# Patient Record
Sex: Female | Born: 1985 | Hispanic: Yes | Marital: Married | Smoking: Never smoker
Health system: Southern US, Community
[De-identification: ages and names within clinical notes are randomized; demographics above are authoritative.]

## PROBLEM LIST (undated history)

## (undated) DIAGNOSIS — L409 Psoriasis, unspecified: Secondary | ICD-10-CM

## (undated) HISTORY — DX: Psoriasis, unspecified: L40.9

---

## 2011-09-27 DIAGNOSIS — IMO0002 Reserved for concepts with insufficient information to code with codable children: Secondary | ICD-10-CM

## 2015-06-21 LAB — OB RESULTS CONSOLE HGB/HCT, BLOOD
HCT: 34 %
Hemoglobin: 11.5 g/dL

## 2015-06-21 LAB — OB RESULTS CONSOLE ANTIBODY SCREEN: Antibody Screen: NEGATIVE

## 2015-06-21 LAB — OB RESULTS CONSOLE PLATELET COUNT: Platelets: 280 10*3/uL

## 2015-06-21 LAB — OB RESULTS CONSOLE HIV ANTIBODY (ROUTINE TESTING): HIV: NONREACTIVE

## 2015-06-21 LAB — OB RESULTS CONSOLE ABO/RH: RH TYPE: POSITIVE

## 2015-06-21 LAB — OB RESULTS CONSOLE RUBELLA ANTIBODY, IGM: Rubella: IMMUNE

## 2015-06-21 LAB — OB RESULTS CONSOLE HEPATITIS B SURFACE ANTIGEN: HEP B S AG: NEGATIVE

## 2015-08-15 ENCOUNTER — Encounter: Payer: Self-pay | Admitting: *Deleted

## 2015-08-15 DIAGNOSIS — Z3490 Encounter for supervision of normal pregnancy, unspecified, unspecified trimester: Secondary | ICD-10-CM

## 2015-08-31 ENCOUNTER — Encounter: Payer: Self-pay | Admitting: Obstetrics & Gynecology

## 2015-09-14 ENCOUNTER — Encounter: Payer: Self-pay | Admitting: Obstetrics & Gynecology

## 2015-09-14 ENCOUNTER — Ambulatory Visit (INDEPENDENT_AMBULATORY_CARE_PROVIDER_SITE_OTHER): Admitting: Obstetrics & Gynecology

## 2015-09-14 VITALS — BP 126/75 | HR 69 | Wt 149.8 lb

## 2015-09-14 DIAGNOSIS — Z3492 Encounter for supervision of normal pregnancy, unspecified, second trimester: Secondary | ICD-10-CM | POA: Diagnosis not present

## 2015-09-14 DIAGNOSIS — Z3689 Encounter for other specified antenatal screening: Secondary | ICD-10-CM

## 2015-09-14 DIAGNOSIS — Z36 Encounter for antenatal screening of mother: Secondary | ICD-10-CM

## 2015-09-14 DIAGNOSIS — O34219 Maternal care for unspecified type scar from previous cesarean delivery: Secondary | ICD-10-CM | POA: Insufficient documentation

## 2015-09-14 LAB — POCT URINALYSIS DIP (DEVICE)
BILIRUBIN URINE: NEGATIVE
Glucose, UA: 500 mg/dL — AB
KETONES UR: NEGATIVE mg/dL
LEUKOCYTES UA: NEGATIVE
NITRITE: NEGATIVE
PH: 6 (ref 5.0–8.0)
Protein, ur: NEGATIVE mg/dL
SPECIFIC GRAVITY, URINE: 1.01 (ref 1.005–1.030)
Urobilinogen, UA: 0.2 mg/dL (ref 0.0–1.0)

## 2015-09-14 NOTE — Progress Notes (Signed)
New ob packet given  Edema- feet  Pain- ligament  Pt requests for pap smear to be completed post delivery

## 2015-09-14 NOTE — Progress Notes (Signed)
Subjective:  Beverly Moran is a 30 y.o. G2P1001 at 403w5d being seen today for transfer of prenatal care from [place]. No issues during pregnancy thus far, had one term cesarean section.  She is currently monitored for the following issues for this low-risk pregnancy and has Supervision of low-risk pregnancy and Previous cesarean delivery, antepartum on her problem list.  Patient reports no complaints.  Contractions: Not present. Vag. Bleeding: None.  Movement: Present. Denies leaking of fluid.   Past Medical History  Diagnosis Date  . Psoriasis    Past Surgical History  Procedure Laterality Date  . Cesarean section  2013   The following portions of the patient's history were reviewed and updated as appropriate: allergies, current medications, past family history, past medical history, past social history, past surgical history and problem list. Problem list updated.  Objective:   Filed Vitals:   09/14/15 0925  BP: 126/75  Pulse: 69  Weight: 149 lb 12.8 oz (67.949 kg)    Fetal Status: Fetal Heart Rate (bpm): 152 Fundal Height: 23 cm Movement: Present     General:  Alert, oriented and cooperative. Patient is in no acute distress.  Skin: Skin is warm and dry. No rash noted.   Cardiovascular: Normal heart rate noted  Respiratory: Normal respiratory effort, no problems with respiration noted  Abdomen: Soft, gravid, appropriate for gestational age. Pain/Pressure: Present     Pelvic: Vag. Bleeding: None    Cervical exam deferred        Extremities: Normal range of motion.  Edema: Trace  Mental Status: Normal mood and affect. Normal behavior. Normal judgment and thought content.   Urinalysis: Urine Protein: Negative Urine Glucose: 3+  Assessment and Plan:  Pregnancy: G2P1001 at 353w5d  1. Previous cesarean delivery, antepartum She was given TOLAC consent to review at home. Will discuss/sign next visit.  2. Encounter for fetal anatomic survey - US MFM OB COMP + 14  WK; Future  3. Supervision of low-risk pregnancy, second trimester - AFP, Quad Screen.   Rest of labs reviewed  The [facility] with multiple MDs and other Advanced Practice Providers was explained to patient; also emphasized that residents, students are part of our team. Preterm labor symptoms and general obstetric precautions including but not limited to vaginal bleeding, contractions, leaking of fluid and fetal movement were reviewed in detail with the patient. Please refer to After Visit Summary for other counseling recommendations.  Return in about 4 weeks (around 10/12/2015) for 1 hr GTT, OB Visit, 3rd trimester labs, TDap.   Tereso NewcomerUgonna A Ozell Juhasz, MD

## 2015-09-14 NOTE — Progress Notes (Signed)
U/S scheduled 04/27 @ 815am.

## 2015-09-14 NOTE — Patient Instructions (Addendum)
Return to clinic for any scheduled appointments or obstetric concerns, or go to [facility] for evaluation  Trial of Labor After Cesarean Delivery Information A trial of labor after cesarean delivery (TOLAC) is when a woman tries to give birth vaginally after a previous cesarean delivery. TOLAC may be a safe and appropriate option for you depending on your medical history and other risk factors. When TOLAC is successful and you are able to have a vaginal delivery, this is called a vaginal birth after cesarean delivery (VBAC).  CANDIDATES FOR TOLAC TOLAC is possible for some women who:  Have undergone one or two prior cesarean deliveries in which the incision of the uterus was horizontal (low transverse).  Are carrying twins and have had one prior low transverse incision during a cesarean delivery.  Do not have a vertical (classical) uterine scar.  Have not had a tear in the wall of their uterus (uterine rupture). TOLAC is also supported for women who meet appropriate criteria and:  Are under the age of 40 years.  Are tall and have a body mass index (BMI) of less than 30.  Have an unknown uterine scar.  Give birth in a facility equipped to handle an emergency cesarean delivery. This team should be able to handle possible complications such as a uterine rupture.  Have thorough counseling about the benefits and risks of TOLAC.  Have discussed future pregnancy plans with their health care provider.  Plan to have several more pregnancies. MOST SUCCESSFUL CANDIDATES FOR TOLAC:  Have had a successful vaginal delivery before or after their cesarean delivery.  Experience labor that begins naturally on or before the due date (40 weeks of gestation).  Do not have a very large (macrosomic) baby.   Had a prior cesarean delivery but are not currently experiencing factors that would prompt a cesarean delivery (such as a breech position).  Had only one prior cesarean delivery.  Had a prior  cesarean delivery that was performed early in labor and not after full cervical dilation. TOLAC may be most appropriate for women who meet the above guidelines and who plan to have more pregnancies. TOLAC is not recommended for home births. LEAST SUCCESSFUL CANDIDATES FOR TOLAC:  Have an induced labor with an unfavorable cervix. An unfavorable cervix is when the cervix is not dilating enough (among other factors).  Have never had a vaginal delivery.  Have had more than two cesarean deliveries.  Have a pregnancy at more than 40 weeks of gestation.  Are pregnant with a baby with a suspected weight greater than 4,000 grams (8 pounds) and who have no prior history of a vaginal delivery.  Have closely spaced pregnancies. SUGGESTED BENEFITS OF TOLAC  You may have a faster recovery time.  You may have a shorter stay in the hospital.  You may have less pain and fewer problems than with a cesarean delivery. Women who have a cesarean delivery have a higher chance of needing blood or getting a fever, an infection, or a blood clot in the legs. SUGGESTED RISKS OF TOLAC The highest risk of complications happens to women who attempt a TOLAC and fail. A failed TOLAC results in an unplanned cesarean delivery. Risks related to TOLAC or repeat cesarean deliveries include:   Blood loss.  Infection.  Blood clot.  Injury to surrounding tissues or organs.  Having to remove the uterus (hysterectomy).  Potential problems with the placenta (such as placenta previa or placenta accreta) in future pregnancies. Although very rare, the main concerns   with TOLAC are:  Rupture of the uterine scar from a past cesarean delivery.  Needing an emergency cesarean delivery.  Having a bad outcome for the baby (perinatal morbidity). FOR MORE INFORMATION American Congress of Obstetricians and Gynecologists: www.acog.org [employer]: www.midwife.org   This information is not intended to  replace advice given to you by your health care provider. Make sure you discuss any questions you have with your health care provider.   Document Released: 01/29/2011 Document Revised: 03/03/2013 Document Reviewed: 11/02/2012 Elsevier Interactive Patient Education 2016 [employer]  Second Trimester of Pregnancy The second trimester is from week 13 through week 28, months 4 through 6. The second trimester is often a time when you feel your best. Your body has also adjusted to being pregnant, and you begin to feel better physically. Usually, morning sickness has lessened or quit completely, you may have more energy, and you may have an increase in appetite. The second trimester is also a time when the fetus is growing rapidly. At the end of the sixth month, the fetus is about 9 inches long and weighs about 1 pounds. You will likely begin to feel the baby move (quickening) between 18 and 20 weeks of the pregnancy. BODY CHANGES Your body goes through many changes during pregnancy. The changes vary from woman to woman.   Your weight will continue to increase. You will notice your lower abdomen bulging out.  You may begin to get stretch marks on your hips, abdomen, and breasts.  You may develop headaches that can be relieved by medicines approved by your health care provider.  You may urinate more often because the fetus is pressing on your bladder.  You may develop or continue to have heartburn as a result of your pregnancy.  You may develop constipation because certain hormones are causing the muscles that push waste through your intestines to slow down.  You may develop hemorrhoids or swollen, bulging veins (varicose veins).  You may have back pain because of the weight gain and pregnancy hormones relaxing your joints between the bones in your pelvis and as a result of a shift in weight and the muscles that support your balance.  Your breasts will continue to grow and be tender.  Your  gums may bleed and may be sensitive to brushing and flossing.  Dark spots or blotches (chloasma, mask of pregnancy) may develop on your face. This will likely fade after the baby is born.  A dark line from your belly button to the pubic area (linea nigra) may appear. This will likely fade after the baby is born.  You may have changes in your hair. These can include thickening of your hair, rapid growth, and changes in texture. Some women also have hair loss during or after pregnancy, or hair that feels dry or thin. Your hair will most likely return to normal after your baby is born. WHAT TO EXPECT AT YOUR PRENATAL VISITS During a routine prenatal visit:  You will be weighed to make sure you and the fetus are growing normally.  Your blood pressure will be taken.  Your abdomen will be measured to track your baby's growth.  The fetal heartbeat will be listened to.  Any test results from the previous visit will be discussed. Your health care provider may ask you:  How you are feeling.  If you are feeling the baby move.  If you have had any abnormal symptoms, such as leaking fluid, bleeding, severe headaches, or abdominal  cramping.  If you are using any tobacco products, including cigarettes, chewing tobacco, and electronic cigarettes.  If you have any questions. Other tests that may be performed during your second trimester include:  Blood tests that check for:  Low iron levels (anemia).  Gestational diabetes (between 24 and 28 weeks).  Rh antibodies.  Urine tests to check for infections, diabetes, or protein in the urine.  An ultrasound to confirm the proper growth and development of the baby.  An amniocentesis to check for possible genetic problems.  Fetal screens for spina bifida and Down syndrome.  HIV (human immunodeficiency virus) testing. Routine prenatal testing includes screening for HIV, unless you choose not to have this test. HOME CARE INSTRUCTIONS   Avoid  all smoking, herbs, alcohol, and unprescribed drugs. These chemicals affect the formation and growth of the baby.  Do not use any tobacco products, including cigarettes, chewing tobacco, and electronic cigarettes. If you need help quitting, ask your health care provider. You may receive counseling support and other resources to help you quit.  Follow your health care provider's instructions regarding medicine use. There are medicines that are either safe or unsafe to take during pregnancy.  Exercise only as directed by your health care provider. Experiencing uterine cramps is a good sign to stop exercising.  Continue to eat regular, healthy meals.  Wear a good support bra for breast tenderness.  Do not use hot tubs, steam rooms, or saunas.  Wear your seat belt at all times when driving.  Avoid raw meat, uncooked cheese, cat litter boxes, and soil used by cats. These carry germs that can cause birth defects in the baby.  Take your prenatal vitamins.  Take 1500-2000 mg of calcium daily starting at the 20th week of pregnancy until you deliver your baby.  Try taking a stool softener (if your health care provider approves) if you develop constipation. Eat more high-fiber foods, such as fresh vegetables or fruit and whole grains. Drink plenty of fluids to keep your urine clear or pale yellow.  Take warm sitz baths to soothe any pain or discomfort caused by hemorrhoids. Use hemorrhoid cream if your health care provider approves.  If you develop varicose veins, wear support hose. Elevate your feet for 15 minutes, 3-4 times a day. Limit salt in your diet.  Avoid heavy lifting, wear low heel shoes, and practice good posture.  Rest with your legs elevated if you have leg cramps or low back pain.  Visit your dentist if you have not gone yet during your pregnancy. Use a soft toothbrush to brush your teeth and be gentle when you floss.  A sexual relationship may be continued unless your health  care provider directs you otherwise.  Continue to go to all your prenatal visits as directed by your health care provider. SEEK MEDICAL CARE IF:   You have dizziness.  You have mild pelvic cramps, pelvic pressure, or nagging pain in the abdominal area.  You have persistent nausea, vomiting, or diarrhea.  You have a bad smelling vaginal discharge.  You have pain with urination. SEEK IMMEDIATE MEDICAL CARE IF:   You have a fever.  You are leaking fluid from your vagina.  You have spotting or bleeding from your vagina.  You have severe abdominal cramping or pain.  You have rapid weight gain or loss.  You have shortness of breath with chest pain.  You notice sudden or extreme swelling of your face, hands, ankles, feet, or legs.  You have not  felt your baby move in over an hour.  You have severe headaches that do not go away with medicine.  You have vision changes.   This information is not intended to replace advice given to you by your health care provider. Make sure you discuss any questions you have with your health care provider.   Document Released: 05/07/2001 Document Revised: 06/03/2014 Document Reviewed: 07/14/2012 Elsevier Interactive Patient Education 2016 [employer].   AREA PEDIATRIC/FAMILY PRACTICE PHYSICIANS  [facility] [address] [place], [place] [address] Phone - [phone]   Fax - [phone]  JACK AMOS [address] [place], [place]  [address] Phone - [phone]   Fax - [phone]  [facility] [address] [place], [place]  [address] Phone - [phone]   Fax - [phone]  [facility] [address] [place], [place]  [address] Phone - [phone]   Fax - [phone]  [facility] [address] [place], [place]  [address] Phone - [phone]   Fax - [phone]  [facility] [address] [place],  [place]  [address] Phone - [phone]   Fax - [phone]  [facility] [place] [address] [place], [place]  [address] Phone - [phone]   Fax - [phone]  [facility] [address] [place] [place], [place]  [address] Phone - [phone]   Fax - [phone]  [facility] [address] [place], [place]  [address] Phone - [phone]   Fax - [phone] [facility] [place] [address] [place], [place]  [address] Phone - [phone]   Fax - [phone]  [facility] [address] [place], [place]  [address] Phone - [phone]   Fax - [phone]  [facility] [address], [address] [place], [place]  [address] Phone - [phone]   Fax - [phone]  [facility] [place] [address] [place], [place]  [address] Phone - [phone]   Fax - [phone]  [facility] [address] [place], [place]  [address] Phone - [phone]  [facility] [address] [place], [place]  [address] Phone - [phone]   Fax - [phone]  [facility] [address] [place], [place]  [address] Phone - [phone]   Fax - [phone]  [facility] [address] [place], [place]  [address] Phone - [phone]   Fax - [phone]  [facility] [address] [place], [place]  [address] Phone - [phone]   Fax - [phone]   [facility] [address] [place], [place]  [address] Phone - [phone]   Fax - [phone]  [facility] [address] [place], [place]  [address] Phone - [phone]   Fax - [phone]  DAVID RUBIN [address] [place], [place]  [address] Phone - [phone]   Fax - [phone]  [facility] [address] [place], [place]  [address] Phone - [phone]   Fax - [phone]  [facility] Al[facility] Ch[facility] [address] [place], [place]  [address] Phone - [phone]   Fax - [phone] Gerarda Fraction [address] [place], [place]  [address] Phone - [phone]   Fax - [phone]  [facility]  [address] [place], [place]  [address] Phone - [phone]   Fax - [phone]  [facility] [place] [address] [place], [place]  [address] Phone - [phone]   Fax - [phone]

## 2015-09-15 LAB — AFP, QUAD SCREEN
AFP: 112.2 ng/mL
Curr Gest Age: 22.7 weeks
Down Syndrome Scr Risk Est: 1:275 {titer}
HCG, Total: 52.66 IU/mL
INH: 229.3 pg/mL
INTERPRETATION-AFP: NEGATIVE
MOM FOR AFP: 1.32
MOM FOR INH: 1.2
MoM for hCG: 3.2
OPEN SPINA BIFIDA: NEGATIVE
Osb Risk: 1:4670 {titer}
Tri 18 Scr Risk Est: NEGATIVE
Trisomy 18 (Edward) Syndrome Interp.: <1:26500 {titer}
UE3 MOM: 0.6
uE3 Value: 2 ng/mL

## 2015-09-21 ENCOUNTER — Ambulatory Visit (HOSPITAL_COMMUNITY)
Admission: RE | Admit: 2015-09-21 | Discharge: 2015-09-21 | Disposition: A | Discharge status: Home / Self Care | Source: Ambulatory Visit | Attending: Obstetrics & Gynecology | Admitting: Obstetrics & Gynecology

## 2015-09-21 ENCOUNTER — Other Ambulatory Visit: Payer: Self-pay | Admitting: Obstetrics & Gynecology

## 2015-09-21 DIAGNOSIS — O34219 Maternal care for unspecified type scar from previous cesarean delivery: Secondary | ICD-10-CM

## 2015-09-21 DIAGNOSIS — Z3492 Encounter for supervision of normal pregnancy, unspecified, second trimester: Secondary | ICD-10-CM

## 2015-09-21 DIAGNOSIS — Z3A23 23 weeks gestation of pregnancy: Secondary | ICD-10-CM

## 2015-09-21 DIAGNOSIS — Z36 Encounter for antenatal screening of mother: Secondary | ICD-10-CM | POA: Diagnosis present

## 2015-09-21 DIAGNOSIS — O34212 Maternal care for vertical scar from previous cesarean delivery: Secondary | ICD-10-CM | POA: Diagnosis not present

## 2015-09-21 DIAGNOSIS — Z3689 Encounter for other specified antenatal screening: Secondary | ICD-10-CM

## 2015-10-10 ENCOUNTER — Encounter: Payer: Self-pay | Admitting: *Deleted

## 2015-10-10 ENCOUNTER — Ambulatory Visit (INDEPENDENT_AMBULATORY_CARE_PROVIDER_SITE_OTHER): Admitting: Student

## 2015-10-10 ENCOUNTER — Encounter: Payer: Self-pay | Admitting: Student

## 2015-10-10 VITALS — BP 105/58 | HR 74 | Wt 154.2 lb

## 2015-10-10 DIAGNOSIS — Z3482 Encounter for supervision of other normal pregnancy, second trimester: Secondary | ICD-10-CM

## 2015-10-10 DIAGNOSIS — Z3492 Encounter for supervision of normal pregnancy, unspecified, second trimester: Secondary | ICD-10-CM

## 2015-10-10 DIAGNOSIS — O34219 Maternal care for unspecified type scar from previous cesarean delivery: Secondary | ICD-10-CM

## 2015-10-10 DIAGNOSIS — Z23 Encounter for immunization: Secondary | ICD-10-CM | POA: Diagnosis not present

## 2015-10-10 LAB — POCT URINALYSIS DIP (DEVICE)
BILIRUBIN URINE: NEGATIVE
Glucose, UA: 500 mg/dL — AB
KETONES UR: NEGATIVE mg/dL
LEUKOCYTES UA: NEGATIVE
Nitrite: NEGATIVE
PH: 5.5 (ref 5.0–8.0)
Protein, ur: NEGATIVE mg/dL
Specific Gravity, Urine: 1.02 (ref 1.005–1.030)
Urobilinogen, UA: 0.2 mg/dL (ref 0.0–1.0)

## 2015-10-10 LAB — CBC
HCT: 32 % — ABNORMAL LOW (ref 35.0–45.0)
HEMOGLOBIN: 10.6 g/dL — AB (ref 11.7–15.5)
MCH: 29.4 pg (ref 27.0–33.0)
MCHC: 33.1 g/dL (ref 32.0–36.0)
MCV: 88.6 fL (ref 80.0–100.0)
MPV: 8.8 fL (ref 7.5–12.5)
Platelets: 248 10*3/uL (ref 140–400)
RBC: 3.61 MIL/uL — AB (ref 3.80–5.10)
RDW: 14 % (ref 11.0–15.0)
WBC: 12.2 10*3/uL — AB (ref 3.8–10.8)

## 2015-10-10 MED ORDER — TETANUS-DIPHTH-ACELL PERTUSSIS 5-2.5-18.5 LF-MCG/0.5 IM SUSP
0.5000 mL | Freq: Once | INTRAMUSCULAR | Status: AC
  Administered 2015-10-10: 0.5 mL via INTRAMUSCULAR

## 2015-10-10 NOTE — Patient Instructions (Signed)
Second Trimester of Pregnancy  The second trimester is from week 13 through week 28, month 4 through 6. This is often the time in pregnancy that you feel your best. Often times, morning sickness has lessened or quit. You may have more energy, and you may get hungry more often. Your unborn baby (fetus) is growing rapidly. At the end of the sixth month, he or she is about 9 inches long and weighs about 1½ pounds. You will likely feel the baby move (quickening) between 18 and 20 weeks of pregnancy.  HOME CARE   · Avoid all smoking, herbs, and alcohol. Avoid drugs not approved by your doctor.  · Do not use any tobacco products, including cigarettes, chewing tobacco, and electronic cigarettes. If you need help quitting, ask your doctor. You may get counseling or other support to help you quit.  · Only take medicine as told by your doctor. Some medicines are safe and some are not during pregnancy.  · Exercise only as told by your doctor. Stop exercising if you start having cramps.  · Eat regular, healthy meals.  · Wear a good support bra if your breasts are tender.  · Do not use hot tubs, steam rooms, or saunas.  · Wear your seat belt when driving.  · Avoid raw meat, uncooked cheese, and liter boxes and soil used by cats.  · Take your prenatal vitamins.  · Take 1500-2000 milligrams of calcium daily starting at the 20th week of pregnancy until you deliver your baby.  · Try taking medicine that helps you poop (stool softener) as needed, and if your doctor approves. Eat more fiber by eating fresh fruit, vegetables, and whole grains. Drink enough fluids to keep your pee (urine) clear or pale yellow.  · Take warm water baths (sitz baths) to soothe pain or discomfort caused by hemorrhoids. Use hemorrhoid cream if your doctor approves.  · If you have puffy, bulging veins (varicose veins), wear support hose. Raise (elevate) your feet for 15 minutes, 3-4 times a day. Limit salt in your diet.  · Avoid heavy lifting, wear low heals,  and sit up straight.  · Rest with your legs raised if you have leg cramps or low back pain.  · Visit your dentist if you have not gone during your pregnancy. Use a soft toothbrush to brush your teeth. Be gentle when you floss.  · You can have sex (intercourse) unless your doctor tells you not to.  · Go to your doctor visits.  GET HELP IF:   · You feel dizzy.  · You have mild cramps or pressure in your lower belly (abdomen).  · You have a nagging pain in your belly area.  · You continue to feel sick to your stomach (nauseous), throw up (vomit), or have watery poop (diarrhea).  · You have bad smelling fluid coming from your vagina.  · You have pain with peeing (urination).  GET HELP RIGHT AWAY IF:   · You have a fever.  · You are leaking fluid from your vagina.  · You have spotting or bleeding from your vagina.  · You have severe belly cramping or pain.  · You lose or gain weight rapidly.  · You have trouble catching your breath and have chest pain.  · You notice sudden or extreme puffiness (swelling) of your face, hands, ankles, feet, or legs.  · You have not felt the baby move in over an hour.  · You have severe headaches that do   not go away with medicine.  · You have vision changes.     This information is not intended to replace advice given to you by your health care provider. Make sure you discuss any questions you have with your health care provider.     Document Released: 08/07/2009 Document Revised: 06/03/2014 Document Reviewed: 07/14/2012  Elsevier Interactive Patient Education ©2016 Elsevier Inc.

## 2015-10-10 NOTE — Progress Notes (Signed)
1hr due at 2:22

## 2015-10-11 LAB — HIV ANTIBODY (ROUTINE TESTING W REFLEX): HIV: NONREACTIVE

## 2015-10-11 LAB — GLUCOSE TOLERANCE, 1 HOUR (50G) W/O FASTING: GLUCOSE, 1 HR, GESTATIONAL: 94 mg/dL (ref <140)

## 2015-10-11 NOTE — Progress Notes (Signed)
Subjective:  Beverly Moran is a 30 y.o. G2P1001 at 7112w4d being seen today for ongoing prenatal care.  She is currently monitored for the following issues for this low-risk pregnancy and has Supervision of low-risk pregnancy and Previous cesarean delivery, antepartum on her problem list.  Patient reports no complaints.  Contractions: Not present. Vag. Bleeding: None.  Movement: Present. Denies leaking of fluid.   The following portions of the patient's history were reviewed and updated as appropriate: allergies, current medications, past family history, past medical history, past social history, past surgical history and problem list. Problem list updated.  Objective:   Filed Vitals:   10/10/15 1321  BP: 105/58  Pulse: 74  Weight: 154 lb 3.2 oz (69.945 kg)    Fetal Status: Fetal Heart Rate (bpm): 148 Fundal Height: 26 cm Movement: Present     General:  Alert, oriented and cooperative. Patient is in no acute distress.  Skin: Skin is warm and dry. No rash noted.   Cardiovascular: Normal heart rate noted  Respiratory: Normal respiratory effort, no problems with respiration noted  Abdomen: Soft, gravid, appropriate for gestational age. Pain/Pressure: Absent     Pelvic: Vag. Bleeding: None     Cervical exam deferred        Extremities: Normal range of motion.  Edema: Trace  Mental Status: Normal mood and affect. Normal behavior. Normal judgment and thought content.   Urinalysis: Urine Protein: Negative Urine Glucose: 3+  Assessment and Plan:  Pregnancy: G2P1001 at 4912w4d  1. Previous cesarean delivery, antepartum  - Glucose Tolerance, 1 HR (50g) w/o Fasting - CBC - RPR - HIV antibody (with reflex) - Tdap (BOOSTRIX) injection 0.5 mL; Inject 0.5 mLs into the muscle once.  2. Supervision of low-risk pregnancy, second trimester  - Tdap (BOOSTRIX) injection 0.5 mL; Inject 0.5 mLs into the muscle once.  Preterm labor symptoms and general obstetric precautions including but not  limited to vaginal bleeding, contractions, leaking of fluid and fetal movement were reviewed in detail with the patient. Please refer to After Visit Summary for other counseling recommendations.  Return in about 4 weeks (around 11/07/2015) for Routine OB.   Judeth HornErin Landy Dunnavant, NP

## 2015-10-12 LAB — RPR

## 2015-11-08 ENCOUNTER — Ambulatory Visit (INDEPENDENT_AMBULATORY_CARE_PROVIDER_SITE_OTHER): Admitting: Family Medicine

## 2015-11-08 VITALS — BP 123/56 | HR 69 | Wt 158.5 lb

## 2015-11-08 DIAGNOSIS — Z3493 Encounter for supervision of normal pregnancy, unspecified, third trimester: Secondary | ICD-10-CM

## 2015-11-08 DIAGNOSIS — O34219 Maternal care for unspecified type scar from previous cesarean delivery: Secondary | ICD-10-CM

## 2015-11-08 LAB — POCT URINALYSIS DIP (DEVICE)
BILIRUBIN URINE: NEGATIVE
Glucose, UA: 100 mg/dL — AB
KETONES UR: NEGATIVE mg/dL
Leukocytes, UA: NEGATIVE
Nitrite: NEGATIVE
PH: 6 (ref 5.0–8.0)
PROTEIN: NEGATIVE mg/dL
SPECIFIC GRAVITY, URINE: 1.02 (ref 1.005–1.030)
Urobilinogen, UA: 0.2 mg/dL (ref 0.0–1.0)

## 2015-11-08 NOTE — Progress Notes (Signed)
Breastfeeding tip of the week reviewed. 

## 2015-11-08 NOTE — Patient Instructions (Signed)
Breastfeeding Deciding to breastfeed is one of the best choices you can make for you and your baby. A change in hormones during pregnancy causes your breast tissue to grow and increases the number and size of your milk ducts. These hormones also allow proteins, sugars, and fats from your blood supply to make breast milk in your milk-producing glands. Hormones prevent breast milk from being released before your baby is born as well as prompt milk flow after birth. Once breastfeeding has begun, thoughts of your baby, as well as his or her sucking or crying, can stimulate the release of milk from your milk-producing glands.  BENEFITS OF BREASTFEEDING For Your Baby  Your first milk (colostrum) helps your baby's digestive system function better.  There are antibodies in your milk that help your baby fight off infections.  Your baby has a lower incidence of asthma, allergies, and sudden infant death syndrome.  The nutrients in breast milk are better for your baby than infant formulas and are designed uniquely for your baby's needs.  Breast milk improves your baby's brain development.  Your baby is less likely to develop other conditions, such as childhood obesity, asthma, or type 2 diabetes mellitus. For You  Breastfeeding helps to create a very special bond between you and your baby.  Breastfeeding is convenient. Breast milk is always available at the correct temperature and costs nothing.  Breastfeeding helps to burn calories and helps you lose the weight gained during pregnancy.  Breastfeeding makes your uterus contract to its prepregnancy size faster and slows bleeding (lochia) after you give birth.   Breastfeeding helps to lower your risk of developing type 2 diabetes mellitus, osteoporosis, and breast or ovarian cancer later in life. SIGNS THAT YOUR BABY IS HUNGRY Early Signs of Hunger  Increased alertness or activity.  Stretching.  Movement of the head from side to  side.  Movement of the head and opening of the mouth when the corner of the mouth or cheek is stroked (rooting).  Increased sucking sounds, smacking lips, cooing, sighing, or squeaking.  Hand-to-mouth movements.  Increased sucking of fingers or hands. Late Signs of Hunger  Fussing.  Intermittent crying. Extreme Signs of Hunger Signs of extreme hunger will require calming and consoling before your baby will be able to breastfeed successfully. Do not wait for the following signs of extreme hunger to occur before you initiate breastfeeding:  Restlessness.  A loud, strong cry.  Screaming. BREASTFEEDING BASICS Breastfeeding Initiation  Find a comfortable place to sit or lie down, with your neck and back well supported.  Place a pillow or rolled up blanket under your baby to bring him or her to the level of your breast (if you are seated). Nursing pillows are specially designed to help support your arms and your baby while you breastfeed.  Make sure that your baby's abdomen is facing your abdomen.  Gently massage your breast. With your fingertips, massage from your chest wall toward your nipple in a circular motion. This encourages milk flow. You may need to continue this action during the feeding if your milk flows slowly.  Support your breast with 4 fingers underneath and your thumb above your nipple. Make sure your fingers are well away from your nipple and your baby's mouth.  Stroke your baby's lips gently with your finger or nipple.  When your baby's mouth is open wide enough, quickly bring your baby to your breast, placing your entire nipple and as much of the colored area around your nipple (  areola) as possible into your baby's mouth.  More areola should be visible above your baby's upper lip than below the lower lip.  Your baby's tongue should be between his or her lower gum and your breast.  Ensure that your baby's mouth is correctly positioned around your nipple  (latched). Your baby's lips should create a seal on your breast and be turned out (everted).  It is common for your baby to suck about 2-3 minutes in order to start the flow of breast milk. Latching Teaching your baby how to latch on to your breast properly is very important. An improper latch can cause nipple pain and decreased milk supply for you and poor weight gain in your baby. Also, if your baby is not latched onto your nipple properly, he or she may swallow some air during feeding. This can make your baby fussy. Burping your baby when you switch breasts during the feeding can help to get rid of the air. However, teaching your baby to latch on properly is still the best way to prevent fussiness from swallowing air while breastfeeding. Signs that your baby has successfully latched on to your nipple:  Silent tugging or silent sucking, without causing you pain.  Swallowing heard between every 3-4 sucks.  Muscle movement above and in front of his or her ears while sucking. Signs that your baby has not successfully latched on to nipple:  Sucking sounds or smacking sounds from your baby while breastfeeding.  Nipple pain. If you think your baby has not latched on correctly, slip your finger into the corner of your baby's mouth to break the suction and place it between your baby's gums. Attempt breastfeeding initiation again. Signs of Successful Breastfeeding Signs from your baby:  A gradual decrease in the number of sucks or complete cessation of sucking.  Falling asleep.  Relaxation of his or her body.  Retention of a small amount of milk in his or her mouth.  Letting go of your breast by himself or herself. Signs from you:  Breasts that have increased in firmness, weight, and size 1-3 hours after feeding.  Breasts that are softer immediately after breastfeeding.  Increased milk volume, as well as a change in milk consistency and color by the fifth day of breastfeeding.  Nipples  that are not sore, cracked, or bleeding. Signs That Your Baby is Getting Enough Milk  Wetting at least 3 diapers in a 24-hour period. The urine should be clear and pale yellow by age 5 days.  At least 3 stools in a 24-hour period by age 5 days. The stool should be soft and yellow.  At least 3 stools in a 24-hour period by age 7 days. The stool should be seedy and yellow.  No loss of weight greater than 10% of birth weight during the first 3 days of age.  Average weight gain of 4-7 ounces (113-198 g) per week after age 4 days.  Consistent daily weight gain by age 5 days, without weight loss after the age of 2 weeks. After a feeding, your baby may spit up a small amount. This is common. BREASTFEEDING FREQUENCY AND DURATION Frequent feeding will help you make more milk and can prevent sore nipples and breast engorgement. Breastfeed when you feel the need to reduce the fullness of your breasts or when your baby shows signs of hunger. This is called "breastfeeding on demand." Avoid introducing a pacifier to your baby while you are working to establish breastfeeding (the first 4-6 weeks   after your baby is born). After this time you may choose to use a pacifier. Research has shown that pacifier use during the first year of a baby's life decreases the risk of sudden infant death syndrome (SIDS). Allow your baby to feed on each breast as long as he or she wants. Breastfeed until your baby is finished feeding. When your baby unlatches or falls asleep while feeding from the first breast, offer the second breast. Because newborns are often sleepy in the first few weeks of life, you may need to awaken your baby to get him or her to feed. Breastfeeding times will vary from baby to baby. However, the following rules can serve as a guide to help you ensure that your baby is properly fed:  Newborns (babies 4 weeks of age or younger) may breastfeed every 1-3 hours.  Newborns should not go longer than 3 hours  during the day or 5 hours during the night without breastfeeding.  You should breastfeed your baby a minimum of 8 times in a 24-hour period until you begin to introduce solid foods to your baby at around 6 months of age. BREAST MILK PUMPING Pumping and storing breast milk allows you to ensure that your baby is exclusively fed your breast milk, even at times when you are unable to breastfeed. This is especially important if you are going back to work while you are still breastfeeding or when you are not able to be present during feedings. Your lactation consultant can give you guidelines on how long it is safe to store breast milk. A breast pump is a machine that allows you to pump milk from your breast into a sterile bottle. The pumped breast milk can then be stored in a refrigerator or freezer. Some breast pumps are operated by hand, while others use electricity. Ask your lactation consultant which type will work best for you. Breast pumps can be purchased, but some hospitals and breastfeeding support groups lease breast pumps on a monthly basis. A lactation consultant can teach you how to hand express breast milk, if you prefer not to use a pump. CARING FOR YOUR BREASTS WHILE YOU BREASTFEED Nipples can become dry, cracked, and sore while breastfeeding. The following recommendations can help keep your breasts moisturized and healthy:  Avoid using soap on your nipples.  Wear a supportive bra. Although not required, special nursing bras and tank tops are designed to allow access to your breasts for breastfeeding without taking off your entire bra or top. Avoid wearing underwire-style bras or extremely tight bras.  Air dry your nipples for 3-4minutes after each feeding.  Use only cotton bra pads to absorb leaked breast milk. Leaking of breast milk between feedings is normal.  Use lanolin on your nipples after breastfeeding. Lanolin helps to maintain your skin's normal moisture barrier. If you use  pure lanolin, you do not need to wash it off before feeding your baby again. Pure lanolin is not toxic to your baby. You may also hand express a few drops of breast milk and gently massage that milk into your nipples and allow the milk to air dry. In the first few weeks after giving birth, some women experience extremely full breasts (engorgement). Engorgement can make your breasts feel heavy, warm, and tender to the touch. Engorgement peaks within 3-5 days after you give birth. The following recommendations can help ease engorgement:  Completely empty your breasts while breastfeeding or pumping. You may want to start by applying warm, moist heat (in   the shower or with warm water-soaked hand towels) just before feeding or pumping. This increases circulation and helps the milk flow. If your baby does not completely empty your breasts while breastfeeding, pump any extra milk after he or she is finished.  Wear a snug bra (nursing or regular) or tank top for 1-2 days to signal your body to slightly decrease milk production.  Apply ice packs to your breasts, unless this is too uncomfortable for you.  Make sure that your baby is latched on and positioned properly while breastfeeding. If engorgement persists after 48 hours of following these recommendations, contact your health care provider or a lactation consultant. OVERALL HEALTH CARE RECOMMENDATIONS WHILE BREASTFEEDING  Eat healthy foods. Alternate between meals and snacks, eating 3 of each per day. Because what you eat affects your breast milk, some of the foods may make your baby more irritable than usual. Avoid eating these foods if you are sure that they are negatively affecting your baby.  Drink milk, fruit juice, and water to satisfy your thirst (about 10 glasses a day).  Rest often, relax, and continue to take your prenatal vitamins to prevent fatigue, stress, and anemia.  Continue breast self-awareness checks.  Avoid chewing and smoking  tobacco. Chemicals from cigarettes that pass into breast milk and exposure to secondhand smoke may harm your baby.  Avoid alcohol and drug use, including marijuana. Some medicines that may be harmful to your baby can pass through breast milk. It is important to ask your health care provider before taking any medicine, including all over-the-counter and prescription medicine as well as vitamin and herbal supplements. It is possible to become pregnant while breastfeeding. If birth control is desired, ask your health care provider about options that will be safe for your baby. SEEK MEDICAL CARE IF:  You feel like you want to stop breastfeeding or have become frustrated with breastfeeding.  You have painful breasts or nipples.  Your nipples are cracked or bleeding.  Your breasts are red, tender, or warm.  You have a swollen area on either breast.  You have a fever or chills.  You have nausea or vomiting.  You have drainage other than breast milk from your nipples.  Your breasts do not become full before feedings by the fifth day after you give birth.  You feel sad and depressed.  Your baby is too sleepy to eat well.  Your baby is having trouble sleeping.   Your baby is wetting less than 3 diapers in a 24-hour period.  Your baby has less than 3 stools in a 24-hour period.  Your baby's skin or the white part of his or her eyes becomes yellow.   Your baby is not gaining weight by 5 days of age. SEEK IMMEDIATE MEDICAL CARE IF:  Your baby is overly tired (lethargic) and does not want to wake up and feed.  Your baby develops an unexplained fever.   This information is not intended to replace advice given to you by your health care provider. Make sure you discuss any questions you have with your health care provider.   Document Released: 05/13/2005 Document Revised: 02/01/2015 Document Reviewed: 11/04/2012 Elsevier Interactive Patient Education 2016 [employer] Third  Trimester of Pregnancy The third trimester is from week 29 through week 42, months 7 through 9. The third trimester is a time when the fetus is growing rapidly. At the end of the ninth month, the fetus is about 20 inches in length and weighs 6-10 pounds.    BODY CHANGES Your body goes through many changes during pregnancy. The changes vary from woman to woman.   Your weight will continue to increase. You can expect to gain 25-35 pounds (11-16 kg) by the end of the pregnancy.  You may begin to get stretch marks on your hips, abdomen, and breasts.  You may urinate more often because the fetus is moving lower into your pelvis and pressing on your bladder.  You may develop or continue to have heartburn as a result of your pregnancy.  You may develop constipation because certain hormones are causing the muscles that push waste through your intestines to slow down.  You may develop hemorrhoids or swollen, bulging veins (varicose veins).  You may have pelvic pain because of the weight gain and pregnancy hormones relaxing your joints between the bones in your pelvis. Backaches may result from overexertion of the muscles supporting your posture.  You may have changes in your hair. These can include thickening of your hair, rapid growth, and changes in texture. Some women also have hair loss during or after pregnancy, or hair that feels dry or thin. Your hair will most likely return to normal after your baby is born.  Your breasts will continue to grow and be tender. A yellow discharge may leak from your breasts called colostrum.  Your belly button may stick out.  You may feel short of breath because of your expanding uterus.  You may notice the fetus "dropping," or moving lower in your abdomen.  You may have a bloody mucus discharge. This usually occurs a few days to a week before labor begins.  Your cervix becomes thin and soft (effaced) near your due date. WHAT TO EXPECT AT YOUR PRENATAL EXAMS   You will have prenatal exams every 2 weeks until week 36. Then, you will have weekly prenatal exams. During a routine prenatal visit:  You will be weighed to make sure you and the fetus are growing normally.  Your blood pressure is taken.  Your abdomen will be measured to track your baby's growth.  The fetal heartbeat will be listened to.  Any test results from the previous visit will be discussed.  You may have a cervical check near your due date to see if you have effaced. At around 36 weeks, your caregiver will check your cervix. At the same time, your caregiver will also perform a test on the secretions of the vaginal tissue. This test is to determine if a type of bacteria, Group B streptococcus, is present. Your caregiver will explain this further. Your caregiver may ask you:  What your birth plan is.  How you are feeling.  If you are feeling the baby move.  If you have had any abnormal symptoms, such as leaking fluid, bleeding, severe headaches, or abdominal cramping.  If you are using any tobacco products, including cigarettes, chewing tobacco, and electronic cigarettes.  If you have any questions. Other tests or screenings that may be performed during your third trimester include:  Blood tests that check for low iron levels (anemia).  Fetal testing to check the health, activity level, and growth of the fetus. Testing is done if you have certain medical conditions or if there are problems during the pregnancy.  HIV (human immunodeficiency virus) testing. If you are at high risk, you may be screened for HIV during your third trimester of pregnancy. FALSE LABOR You may feel small, irregular contractions that eventually go away. These are called Braxton Hicks contractions, or false   labor. Contractions may last for hours, days, or even weeks before true labor sets in. If contractions come at regular intervals, intensify, or become painful, it is best to be seen by your  caregiver.  SIGNS OF LABOR   Menstrual-like cramps.  Contractions that are 5 minutes apart or less.  Contractions that start on the top of the uterus and spread down to the lower abdomen and back.  A sense of increased pelvic pressure or back pain.  A watery or bloody mucus discharge that comes from the vagina. If you have any of these signs before the 37th week of pregnancy, call your caregiver right away. You need to go to the hospital to get checked immediately. HOME CARE INSTRUCTIONS   Avoid all smoking, herbs, alcohol, and unprescribed drugs. These chemicals affect the formation and growth of the baby.  Do not use any tobacco products, including cigarettes, chewing tobacco, and electronic cigarettes. If you need help quitting, ask your health care provider. You may receive counseling support and other resources to help you quit.  Follow your caregiver's instructions regarding medicine use. There are medicines that are either safe or unsafe to take during pregnancy.  Exercise only as directed by your caregiver. Experiencing uterine cramps is a good sign to stop exercising.  Continue to eat regular, healthy meals.  Wear a good support bra for breast tenderness.  Do not use hot tubs, steam rooms, or saunas.  Wear your seat belt at all times when driving.  Avoid raw meat, uncooked cheese, cat litter boxes, and soil used by cats. These carry germs that can cause birth defects in the baby.  Take your prenatal vitamins.  Take 1500-2000 mg of calcium daily starting at the 20th week of pregnancy until you deliver your baby.  Try taking a stool softener (if your caregiver approves) if you develop constipation. Eat more high-fiber foods, such as fresh vegetables or fruit and whole grains. Drink plenty of fluids to keep your urine clear or pale yellow.  Take warm sitz baths to soothe any pain or discomfort caused by hemorrhoids. Use hemorrhoid cream if your caregiver approves.  If  you develop varicose veins, wear support hose. Elevate your feet for 15 minutes, 3-4 times a day. Limit salt in your diet.  Avoid heavy lifting, wear low heal shoes, and practice good posture.  Rest a lot with your legs elevated if you have leg cramps or low back pain.  Visit your dentist if you have not gone during your pregnancy. Use a soft toothbrush to brush your teeth and be gentle when you floss.  A sexual relationship may be continued unless your caregiver directs you otherwise.  Do not travel far distances unless it is absolutely necessary and only with the approval of your caregiver.  Take prenatal classes to understand, practice, and ask questions about the labor and delivery.  Make a trial run to the hospital.  Pack your hospital bag.  Prepare the baby's nursery.  Continue to go to all your prenatal visits as directed by your caregiver. SEEK MEDICAL CARE IF:  You are unsure if you are in labor or if your water has broken.  You have dizziness.  You have mild pelvic cramps, pelvic pressure, or nagging pain in your abdominal area.  You have persistent nausea, vomiting, or diarrhea.  You have a bad smelling vaginal discharge.  You have pain with urination. SEEK IMMEDIATE MEDICAL CARE IF:   You have a fever.  You are leaking fluid   from your vagina.  You have spotting or bleeding from your vagina.  You have severe abdominal cramping or pain.  You have rapid weight loss or gain.  You have shortness of breath with chest pain.  You notice sudden or extreme swelling of your face, hands, ankles, feet, or legs.  You have not felt your baby move in over an hour.  You have severe headaches that do not go away with medicine.  You have vision changes.   This information is not intended to replace advice given to you by your health care provider. Make sure you discuss any questions you have with your health care provider.   Document Released: 05/07/2001 Document  Revised: 06/03/2014 Document Reviewed: 07/14/2012 Elsevier Interactive Patient Education 2016 [employer]  

## 2015-11-08 NOTE — Progress Notes (Signed)
Subjective:  Beverly Moran is a 30 y.o. G2P1001 at 3633w4d being seen today for ongoing prenatal care.  She is currently monitored for the following issues for this low-risk pregnancy and has Supervision of low-risk pregnancy and Previous cesarean delivery, antepartum on her problem list.  Patient reports no complaints.  Contractions: Not present. Vag. Bleeding: None.  Movement: Present. Denies leaking of fluid.   The following portions of the patient's history were reviewed and updated as appropriate: allergies, current medications, past family history, past medical history, past social history, past surgical history and problem list. Problem list updated.  Objective:   Filed Vitals:   11/08/15 0932  BP: 123/56  Pulse: 69  Weight: 158 lb 8 oz (71.895 kg)    Fetal Status: Fetal Heart Rate (bpm): 153   Movement: Present     General:  Alert, oriented and cooperative. Patient is in no acute distress.  Skin: Skin is warm and dry. No rash noted.   Cardiovascular: Normal heart rate noted  Respiratory: Normal respiratory effort, no problems with respiration noted  Abdomen: Soft, gravid, appropriate for gestational age. Pain/Pressure: Absent     Pelvic: Cervical exam deferred        Extremities: Normal range of motion.  Edema: Trace  Mental Status: Normal mood and affect. Normal behavior. Normal judgment and thought content.   Urinalysis: Urine Protein: Negative Urine Glucose: 1+  Assessment and Plan:  Pregnancy: G2P1001 at 7333w4d  1. Previous cesarean delivery, antepartum TOLAC confirmed  2. Supervision of low-risk pregnancy, third trimester Updated pregnancy problem list Patient [place] moving to [place]. Discussed getting records and signing ROR today   Preterm labor symptoms and general obstetric precautions including but not limited to vaginal bleeding, contractions, leaking of fluid and fetal movement were reviewed in detail with the patient. Please refer to After Visit Summary  for other counseling recommendations.  Return in about 2 weeks (around 11/22/2015) for Routine prenatal care.   Federico FlakeKimberly Niles Newton, MD

## 2015-12-04 ENCOUNTER — Encounter: Admitting: Obstetrics and Gynecology

## 2016-06-19 ENCOUNTER — Encounter (HOSPITAL_COMMUNITY): Payer: Self-pay
